# Patient Record
Sex: Female | Born: 2005 | Race: White | Hispanic: Yes | Marital: Single | Smoking: Never smoker
Health system: Southern US, Community
[De-identification: ages and names within clinical notes are randomized; demographics above are authoritative.]

---
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
## 2024-10-10 ENCOUNTER — Ambulatory Visit: Admitting: Nurse Practitioner
